# Patient Record
Sex: Male | Born: 1970 | Hispanic: Refuse to answer | Marital: Married | Smoking: Former smoker
Health system: Southern US, Community
[De-identification: ages and names within clinical notes are randomized; demographics above are authoritative.]

---

## 2016-06-16 DIAGNOSIS — E559 Vitamin D deficiency, unspecified: Secondary | ICD-10-CM | POA: Insufficient documentation

## 2016-06-16 DIAGNOSIS — N529 Male erectile dysfunction, unspecified: Secondary | ICD-10-CM | POA: Insufficient documentation

## 2016-06-16 DIAGNOSIS — E785 Hyperlipidemia, unspecified: Secondary | ICD-10-CM | POA: Insufficient documentation

## 2017-08-15 ENCOUNTER — Ambulatory Visit
Admission: RE | Admit: 2017-08-15 | Discharge: 2017-08-15 | Disposition: A | Discharge status: Home / Self Care | Payer: BLUE CROSS/BLUE SHIELD | Source: Ambulatory Visit | Attending: Medical | Admitting: Medical

## 2017-08-15 ENCOUNTER — Encounter: Payer: Self-pay | Admitting: Medical

## 2017-08-15 ENCOUNTER — Ambulatory Visit: Payer: Self-pay | Admitting: Medical

## 2017-08-15 VITALS — BP 130/82 | HR 87 | Temp 98.9°F | Ht 72.0 in | Wt 215.0 lb

## 2017-08-15 DIAGNOSIS — M25572 Pain in left ankle and joints of left foot: Secondary | ICD-10-CM | POA: Diagnosis present

## 2017-08-15 DIAGNOSIS — M19072 Primary osteoarthritis, left ankle and foot: Secondary | ICD-10-CM | POA: Diagnosis not present

## 2017-08-15 NOTE — Patient Instructions (Addendum)
Ice , elevation , anti-inflammatory like Ibuprofen  every 8 hours with food. Will contact you tomorrow for results.   Ankle Pain Many things can cause ankle pain, including an injury to the area and overuse of the ankle.The ankle joint holds your body weight and allows you to move around. Ankle pain can occur on either side or the back of one ankle or both ankles. Ankle pain may be sharp and burning or dull and aching. There may be tenderness, stiffness, redness, or warmth around the ankle. Follow these instructions at home: Activity  Rest your ankle as told by your health care provider. Avoid any activities that cause ankle pain.  Do exercises as told by your health care provider.  Ask your health care provider if you can drive. Using a brace, a bandage, or crutches  If you were given a brace: ? Wear it as told by your health care provider. ? Remove it when you take a bath or a shower. ? Try not to move your ankle very much, but wiggle your toes from time to time. This helps to prevent swelling.  If you were given an elastic bandage: ? Remove it when you take a bath or a shower. ? Try not to move your ankle very much, but wiggle your toes from time to time. This helps to prevent swelling. ? Adjust the bandage to make it more comfortable if it feels too tight. ? Loosen the bandage if you have numbness or tingling in your foot or if your foot turns cold and blue.  If you have crutches, use them as told by your health care provider. Continue to use them until you can walk without feeling pain in your ankle. Managing pain, stiffness, and swelling  Raise (elevate) your ankle above the level of your heart while you are sitting or lying down.  If directed, apply ice to the area: ? Put ice in a plastic bag. ? Place a towel between your skin and the bag. ? Leave the ice on for 20 minutes, 2-3 times per day. General instructions  Keep all follow-up visits as told by your health care  provider. This is important.  Record this information that may be helpful for you and your health care provider: ? How often you have ankle pain. ? Where the pain is located. ? What the pain feels like.  Take over-the-counter and prescription medicines only as told by your health care provider. Contact a health care provider if:  Your pain gets worse.  Your pain is not relieved with medicines.  You have a fever or chills.  You are having more trouble with walking.  You have new symptoms. Get help right away if:  Your foot, leg, toes, or ankle tingles or becomes numb.  Your foot, leg, toes, or ankle becomes swollen.  Your foot, leg, toes, or ankle turns pale or blue. This information is not intended to replace advice given to you by your health care provider. Make sure you discuss any questions you have with your health care provider. Document Released: 04/20/2010 Document Revised: 07/01/2016 Document Reviewed: 06/02/2015 Elsevier Interactive Patient Education  2017 [employer] Inc.  Elastic Bandage and RICE What does an elastic bandage do? Elastic bandages come in different shapes and sizes. They generally provide support to your injury and reduce swelling while you are healing, but they can perform different functions. Your health care provider will help you to decide what is best for your protection, recovery, or rehabilitation following  an injury. What are some general tips for using an elastic bandage?  Use the bandage as directed by the maker of the bandage that you are using.  Do not wrap the bandage too tightly. This may cut off the circulation in the arm or leg in the area below the bandage. ? If part of your body beyond the bandage becomes blue, numb, cold, swollen, or is more painful, your bandage is most likely too tight. If this occurs, remove your bandage and reapply it more loosely.  See your health care provider if the bandage seems to be making your problems  worse rather than better.  An elastic bandage should be removed and reapplied every 3-4 hours or as directed by your health care provider. What is RICE? The routine care of many injuries includes rest, ice, compression, and elevation (RICE therapy). Rest Rest is required to allow your body to heal. Generally, you can resume your routine activities when you are comfortable and have been given permission by your health care provider. Ice Icing your injury helps to keep the swelling down and it reduces pain. Do not apply ice directly to your skin.  Put ice in a plastic bag.  Place a towel between your skin and the bag.  Leave the ice on for 20 minutes, 2-3 times per day.  Do this for as long as you are directed by your health care provider. Compression Compression helps to keep swelling down, gives support, and helps with discomfort. Compression may be done with an elastic bandage. Elevation Elevation helps to reduce swelling and it decreases pain. If possible, your injured area should be placed at or above the level of your heart or the center of your chest. When should I seek medical care? You should seek medical care if:  You have persistent pain and swelling.  Your symptoms are getting worse rather than improving.  These symptoms may indicate that further evaluation or further X-rays are needed. Sometimes, X-rays may not show a small broken bone (fracture) until a number of days later. Make a follow-up appointment with your health care provider. Ask when your X-ray results will be ready. Make sure that you get your X-ray results. When should I seek immediate medical care? You should seek immediate medical care if:  You have a sudden onset of severe pain at or below the area of your injury.  You develop redness or increased swelling around your injury.  You have tingling or numbness at or below the area of your injury that does not improve after you remove the elastic  bandage.  This information is not intended to replace advice given to you by your health care provider. Make sure you discuss any questions you have with your health care provider. Document Released: 04/22/2002 Document Revised: 09/26/2016 Document Reviewed: 06/16/2014 Elsevier Interactive Patient Education  2017 [employer].  Crutch Use, Adult Crutches are used to take weight off of one of your legs or feet when you stand or walk. You may need crutches to help heal after an injury or procedure. It is important to use crutches that fit right. Your crutches fit right if:  You can fit 2 or 3 fingers between your armpit and the crutch.  You use your hands, not your armpits, to hold yourself up.  Do not put your armpits on the crutches. This can damage the nerves in your shoulders, arms, back, armpits, and hands. It is important that a doctor has seen you use crutches the  right way before you use them at home. How to use your crutches How you will use your crutches will depend on why you need them. Your doctor may tell you not to put weight on (not to support your weight with) your hurt leg (non-weight-bearing). Or, your doctor may let you put (bear) some of your weight on the hurt leg (partial weight-bearing), but not all of your weight. Follow instructions from your doctor about weight-bearing. Do not put weight on your leg in an amount that causes pain. Walking 1. Stand on your good leg and lift both crutches at the same time. 2. Place the crutches one step-length in front of you. 3. Bring the good leg forward to meet the crutches or to land a little bit ahead of them. 4. Repeat. Going up steps If there is no handrail: 1. Step up with your good leg. 2. Step up with the crutches and your hurt leg. 3. Repeat.  If there is a handrail: 1. Hold both crutches in one hand. 2. Place your other hand on the handrail. 3. Put your weight on your arms and lift your good leg up to the  step. 4. Bring the crutches and the hurt leg up to that step. 5. Repeat.  Going up steps on your butt If you do not feel steady on steps, you can go up steps on your butt. 1. Sit on the lowest step. ? Have your hurt leg out in front. ? Use your other hand to hold both crutches flat on the stairs. 2. Scoot your butt up to the next step. Use the free hand and your good leg to help you do this.  Going down steps If there is no handrail: 1. Step down with your hurt leg and crutches. 2. Step down with your good leg. 3. Repeat.  If there is a handrail: 1. Place your hand on the handrail. 2. Hold both crutches with your free hand. 3. Lower your hurt leg and crutch to the step below you. Keep the crutch tips in the center of the step. Never put the crutch tips on the edge of the step. 4. Lower your good leg to that step. 5. Repeat.  Going down steps on your butt If you do not feel steady on steps, you can go down steps on your butt. 1. Sit on the highest step. ? Have your hurt leg out in front. ? Use your other hand to hold both crutches flat on the stairs. 2. Scoot your butt down to the next step. Use the free hand and your good leg to help you do this.  Standing up 1. Hold the hurt leg forward. 2. Grab the armrest with one hand. Use the other hand to grab the top of the crutches. 3. Use the armrest and your crutches to pull yourself up to stand. Sitting down 1. Hold the hurt leg forward. 2. Grab the armrest with one hand. Use the other hand to grab the top of the crutches. 3. Slowly lower yourself to sit. Get help if:  You feel unsteady or wobbly using crutches.  You have any new pain.  You cannot feel a part of your body (numbness) or you have a tingling feeling.  Your crutches do not fit. Get help right away if:  You fall. This information is not intended to replace advice given to you by your health care provider. Make sure you discuss any questions you have with your  health care provider. Document  Released: 04/18/2008 Document Revised: 06/03/2016 Document Reviewed: 04/22/2016 Elsevier Interactive Patient Education  [employer].

## 2017-08-15 NOTE — Progress Notes (Signed)
   Subjective:    Patient ID: Philip Gibson, male    DOB: 1971/06/30, 46 y.o.   MRN: [id number]  HPI 46 yo male non acute distress with  left ankle pain starting yesterday.Denies Trauma, did stairs in parking garage , so sitting for  jury duty and pain came on , started as a dull ache. After dinner with throbbing. Took Ibuprofen 400 mg last night at  11:30pm last night and 2 tablets this morning.  Denies numbness or tingling. Pain continuing to worsen.  Review of Systems  Constitutional: Negative for chills and fever.  HENT: Positive for congestion. Negative for ear pain and sore throat.   Eyes: Negative for discharge and itching.  Respiratory: Negative for cough and shortness of breath.   Cardiovascular: Negative for chest pain.  Gastrointestinal: Negative for abdominal pain.  Endocrine: Negative for polydipsia, polyphagia and polyuria.  Genitourinary: Negative for dysuria.  Musculoskeletal: Positive for gait problem and joint swelling. Negative for back pain and neck stiffness.  Skin: Negative for rash and wound.  Allergic/Immunologic: Positive for environmental allergies. Negative for food allergies.  Neurological: Negative for dizziness, speech difficulty and light-headedness.  Hematological: Negative for adenopathy. Does not bruise/bleed easily.  Psychiatric/Behavioral: Negative for behavioral problems, sleep disturbance and suicidal ideas. The patient is not nervous/anxious.        Objective:   Physical Exam  Constitutional: He is oriented to person, place, and time. He appears well-developed and well-nourished.  HENT:  Head: Normocephalic and atraumatic.  Eyes: Pupils are equal, round, and reactive to light. Conjunctivae and EOM are normal.  Neck: Normal range of motion.  Musculoskeletal: He exhibits edema and tenderness. He exhibits no deformity.  Neurological: He is alert and oriented to person, place, and time.  Skin: Skin is warm and dry. No rash noted. No erythema.   Psychiatric: He has a normal mood and affect. His behavior is normal. Judgment and thought content normal.  Nursing note and vitals reviewed.  Left ankle with anterior and inferior swelling to the lateral malleolus and tender at the medial malleolus with mild swelling. No bruising noted.   5"11.5" for crutch size.    Assessment & Plan:  Left ankle pain. Non- weightbearing.Crutches supplied. Ace wrappedin clinic, showed patient how to do wrap ankle.  Ice, elevate and anti-inflammatory like OTC Ibruprofen 800 mg every  8 hours with food. Will call patient in the morning with results. Has appointment set for  8:30 am.  Declined Ibuprofen in the clinic. Going over now for x-ray. To [facility].  Called patient with results referred to [facility]. Xray results left ankle. There is no evidence of acute fracture, dislocation, or joint effusion. 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 Small loose body in the joint adjacent to the lateral aspect of the tibial plafond. Irregularity of the dome of the talus. Soft tissues are unremarkable.  IMPRESSION: Osteoarthritis of the ankle joint.  No acute abnormality.

## 2017-08-16 ENCOUNTER — Ambulatory Visit: Payer: Self-pay | Admitting: Medical

## 2017-08-16 VITALS — BP 138/70 | HR 71 | Temp 97.4°F | Resp 16 | Ht 71.0 in | Wt 215.0 lb

## 2017-08-16 DIAGNOSIS — M25572 Pain in left ankle and joints of left foot: Secondary | ICD-10-CM

## 2017-08-16 NOTE — Patient Instructions (Signed)
ICE and Elevate, ace wrap, OTC Ibuprofen 800 mg every 8 hours with food x 7 days.    Ankle Sprain An ankle sprain is a stretch or tear in one of the tough, fiber-like tissues (ligaments) in the ankle. The ligaments in your ankle help to hold the bones of the ankle together. What are the causes? This condition is often caused by stepping on or falling on the outer edge of the foot. What increases the risk? This condition is more likely to develop in people who play sports. What are the signs or symptoms? Symptoms of this condition include:  Pain in your ankle.  Swelling.  Bruising. Bruising may develop right after you sprain your ankle or 1-2 days later.  Trouble standing or walking, especially when you turn or change directions.  How is this diagnosed? This condition is diagnosed with a physical exam. During the exam, your health care provider will press on certain parts of your foot and ankle and try to move them in certain ways. X-rays may be taken to see how severe the sprain is and to check for broken bones. How is this treated? This condition may be treated with:  A brace. This is used to keep the ankle from moving until it heals.  An elastic bandage. This is used to support the ankle.  Crutches.  Pain medicine.  Surgery. This may be needed if the sprain is severe.  Physical therapy. This may help to improve the range of motion in the ankle.  Follow these instructions at home:  Rest your ankle.  Take over-the-counter and prescription medicines only as told by your health care provider.  For 2-3 days, keep your ankle raised (elevated) above the level of your heart as much as possible.  If directed, apply ice to the area: ? Put ice in a plastic bag. ? Place a towel between your skin and the bag. ? Leave the ice on for 20 minutes, 2-3 times a day.  If you were given a brace: ? Wear it as directed. ? Remove it to shower or bathe. ? Try not to move your ankle  much, but wiggle your toes from time to time. This helps to prevent swelling.  If you were given an elastic bandage (dressing): ? Remove it to shower or bathe. ? Try not to move your ankle much, but wiggle your toes from time to time. This helps to prevent swelling. ? Adjust the dressing to make it more comfortable if it feels too tight. ? Loosen the dressing if you have numbness or tingling in your foot, or if your foot becomes cold and blue.  If you have crutches, use them as told by your health care provider. Continue to use them until you can walk without feeling pain in your ankle. Contact a health care provider if:  You have rapidly increasing bruising or swelling.  Your pain is not relieved with medicine. Get help right away if:  Your toes or foot becomes numb or blue.  You have severe pain that gets worse. This information is not intended to replace advice given to you by your health care provider. Make sure you discuss any questions you have with your health care provider. Document Released: 10/31/2005 Document Revised: 03/09/2016 Document Reviewed: 06/02/2015 Elsevier Interactive Patient Education  2017 [employer].

## 2017-08-16 NOTE — Progress Notes (Signed)
   Subjective:    Patient ID: Philip Gibson, male    DOB: 05/03/1971, 46 y.o.   MRN: [id number]  HPI 46 yo male  Non acute distress returns today for review of x-ray results. Feelling much better not aching. Still hurts to bear weight but much better than yesterday. Took OTC Ibuprofen 400 mg, with breakfast. No numbness  Or tingling.Best number [phone] He has 4 children at home with  Twin 90 month old boys , he recalls he may have stepped on a toy or something , but does not recall specific time period. 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  Review of Systems  Constitutional: Negative for chills and fever.  HENT: Negative for ear discharge and sore throat.   Respiratory: Negative for cough and shortness of breath.   Cardiovascular: Negative for chest pain.  Gastrointestinal: Negative for abdominal pain.  Genitourinary: Negative for dysuria.  Musculoskeletal: Positive for arthralgias and joint swelling.  Skin: Negative for rash.       Objective:   Physical Exam  Constitutional: He is oriented to person, place, and time. He appears well-developed and well-nourished.  HENT:  Head: Normocephalic and atraumatic.  Right Ear: External ear normal.  Left Ear: External ear normal.  Eyes: Pupils are equal, round, and reactive to light. Conjunctivae and EOM are normal.  Musculoskeletal: He exhibits edema and tenderness.  Neurological: He is alert and oriented to person, place, and time.  Skin: Skin is warm and dry.  Psychiatric: He has a normal mood and affect. His behavior is normal. Judgment and thought content normal.  Nursing note and vitals reviewed.   Patient able to touch step with forefoot. Better compared to yesterday. Ankle still swollen the same as yesterday on medial and lateral side, anterior to lateral malleolus., 2+ PT/DP. <2CR. Rewrapped ankle for patient. He is using crutches.     Assessment & Plan:  Ankle pain.  Most likely sprained. Will call patient and decide on plan once  I have x-ray results. Rewrapped in clinic To continue OTC Ibuprofen 800 mg every 8 hours with food no more than  7 days. Work note saying he may need to go home to ice and elevate ankle. He brought Ibuprofen to work with him. And he has a couple of meetings he needs to attend today.   9:21 am called patient with xray result. There is no evidence of acute fracture, dislocation, or joint effusion. 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 Small loose body in the joint adjacent to the lateral aspect of the tibial plafond. Irregularity of the dome of the talus. Soft tissues are unremarkable. Notified CNovak RN to set up orthopedic appointment ,

## 2017-08-19 DIAGNOSIS — R7989 Other specified abnormal findings of blood chemistry: Secondary | ICD-10-CM | POA: Insufficient documentation

## 2017-11-17 ENCOUNTER — Encounter: Payer: Self-pay | Admitting: Medical

## 2017-11-17 ENCOUNTER — Ambulatory Visit: Payer: Self-pay | Admitting: Medical

## 2017-11-17 VITALS — BP 110/70 | HR 82 | Temp 97.0°F | Resp 16 | Ht 71.0 in | Wt 218.0 lb

## 2017-11-17 DIAGNOSIS — J0110 Acute frontal sinusitis, unspecified: Secondary | ICD-10-CM

## 2017-11-17 MED ORDER — AMOXICILLIN-POT CLAVULANATE 875-125 MG PO TABS: 1.0000 | ORAL_TABLET | Freq: Two times a day (BID) | ORAL | 0 refills | Status: AC

## 2017-11-17 NOTE — Patient Instructions (Signed)
S Sinusitis, Adult Sinusitis is soreness and inflammation of your sinuses. Sinuses are hollow spaces in the bones around your face. They are located:  Around your eyes.  In the middle of your forehead.  Behind your nose.  In your cheekbones.  Your sinuses and nasal passages are lined with a stringy fluid (mucus). Mucus normally drains out of your sinuses. When your nasal tissues get inflamed or swollen, the mucus can get trapped or blocked so air cannot flow through your sinuses. This lets bacteria, viruses, and funguses grow, and that leads to infection. Follow these instructions at home: Medicines  Take, use, or apply over-the-counter and prescription medicines only as told by your doctor. These may include nasal sprays.  If you were prescribed an antibiotic medicine, take it as told by your doctor. Do not stop taking the antibiotic even if you start to feel better. Hydrate and Humidify  Drink enough water to keep your pee (urine) clear or pale yellow.  Use a cool mist humidifier to keep the humidity level in your home above 50%.  Breathe in steam for 10-15 minutes, 3-4 times a day or as told by your doctor. You can do this in the bathroom while a hot shower is running.  Try not to spend time in cool or dry air. Rest  Rest as much as possible.  Sleep with your head raised (elevated).  Make sure to get enough sleep each night. General instructions  Put a warm, moist washcloth on your face 3-4 times a day or as told by your doctor. This will help with discomfort.  Wash your hands often with soap and water. If there is no soap and water, use hand sanitizer.  Do not smoke. Avoid being around people who are smoking (secondhand smoke).  Keep all follow-up visits as told by your doctor. This is important. Contact a doctor if:  You have a fever.  Your symptoms get worse.  Your symptoms do not get better within 10 days. Get help right away if:  You have a very bad  headache.  You cannot stop throwing up (vomiting).  You have pain or swelling around your face or eyes.  You have trouble seeing.  You feel confused.  Your neck is stiff.  You have trouble breathing. This information is not intended to replace advice given to you by your health care provider. Make sure you discuss any questions you have with your health care provider. Document Released: 04/18/2008 Document Revised: 06/26/2016 Document Reviewed: 08/26/2015 Elsevier Interactive Patient Education  [employer]2018 [employer] Inc[employer]

## 2017-11-17 NOTE — Progress Notes (Signed)
   Subjective:    Patient ID: Philip Gibson, male    DOB: 07/03/1971, 47 y.o.   MRN: [id number]  HPI  47 yo male in non acute distress comes in with complaints of starting  5-7 days ago , with pressure and thick yellow mucus, taking OTC Mucinex and Advil. Denies Fever or chills. Pressure on the left more than the right behind eyes and on forehead..  Review of Systems  Constitutional: Negative for chills and fever.  HENT: Positive for postnasal drip, sinus pressure and sinus pain. Negative for ear pain and sore throat (irritated from post nasal drip.).   Eyes: Negative for discharge and itching.  Respiratory: Positive for cough (due to phelgm in throat). Negative for shortness of breath.   Cardiovascular: Negative for chest pain.  Gastrointestinal: Negative for abdominal pain.  Endocrine: Negative for polydipsia, polyphagia and polyuria.  Genitourinary: Negative for dysuria.  Musculoskeletal: Negative for myalgias.  Skin: Negative for rash.  Allergic/Immunologic: Negative for environmental allergies and food allergies.  Neurological: Negative for dizziness, syncope and light-headedness.  Hematological: Negative for adenopathy.  Psychiatric/Behavioral: Negative for behavioral problems, self-injury and suicidal ideas. The patient is not nervous/anxious.        Objective:   Physical Exam  Constitutional: He is oriented to person, place, and time. He appears well-developed and well-nourished.  HENT:  Head: Normocephalic and atraumatic.  Right Ear: Hearing, external ear and ear canal normal. A middle ear effusion is present.  Left Ear: Hearing, external ear and ear canal normal. A middle ear effusion is present.  Nose: Mucosal edema and rhinorrhea present. Left sinus exhibits frontal sinus tenderness.  Mouth/Throat: Uvula is midline. Posterior oropharyngeal erythema present. No posterior oropharyngeal edema.  Eyes: Conjunctivae, EOM and lids are normal. Pupils are equal, round, and  reactive to light.  Neck: Normal range of motion. Neck supple.  Cardiovascular: Normal rate, regular rhythm and normal heart sounds.  Pulmonary/Chest: Effort normal and breath sounds normal.  Musculoskeletal: Normal range of motion.  Lymphadenopathy:    He has no cervical adenopathy.  Neurological: He is alert and oriented to person, place, and time.  Skin: Skin is warm and dry.  Psychiatric: He has a normal mood and affect. His behavior is normal. Judgment and thought content normal.  Nursing note and vitals reviewed.         Assessment & Plan:   Sinusitis Frontal OTC Zyretec., Mucinex and Flonase and Advil take as directed. Meds ordered this encounter  Medications  . amoxicillin-clavulanate (AUGMENTIN) 875-125 MG tablet    Sig: Take 1 tablet by mouth 2 (two) times daily.    Dispense:  20 tablet    Refill:  0  Return in  3-5 days if not improving.  Patient verbalizes understanding and has no questions at discharge.

## 2019-02-07 IMAGING — CR DG ANKLE COMPLETE 3+V*L*
1 series · 3 of 3 positions shown · non-contrast
Comparison: None.

CLINICAL DATA: Acute left ankle pain.  No known injury.

EXAM:
LEFT ANKLE COMPLETE - 3+ VIEW

[Series 1: dg ankle complete left · 0.14mm/px · 3 of 3 slices shown]
[im 1/3]
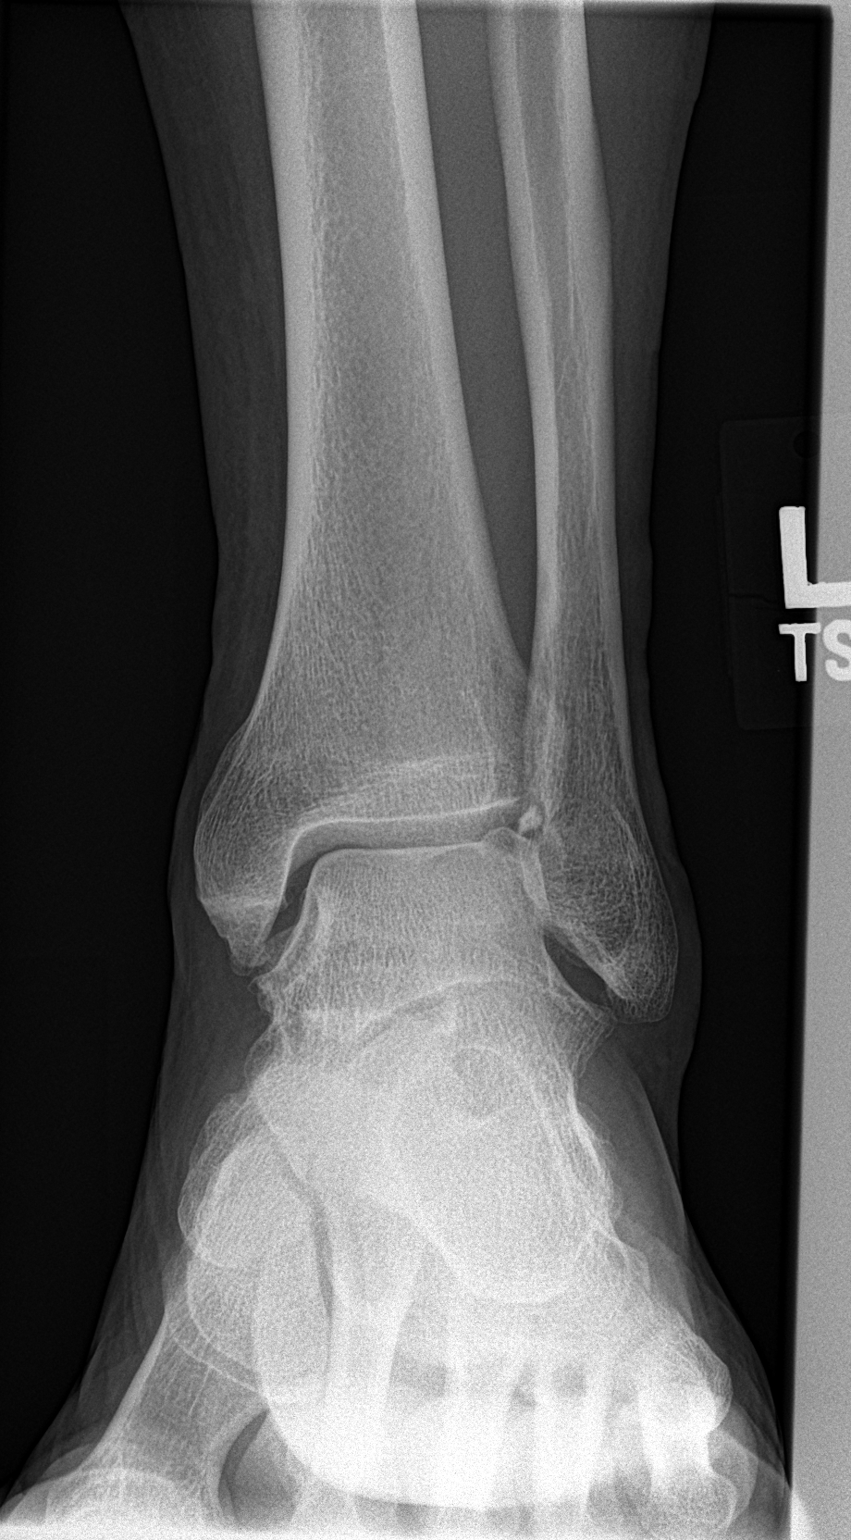
[im 2/3]
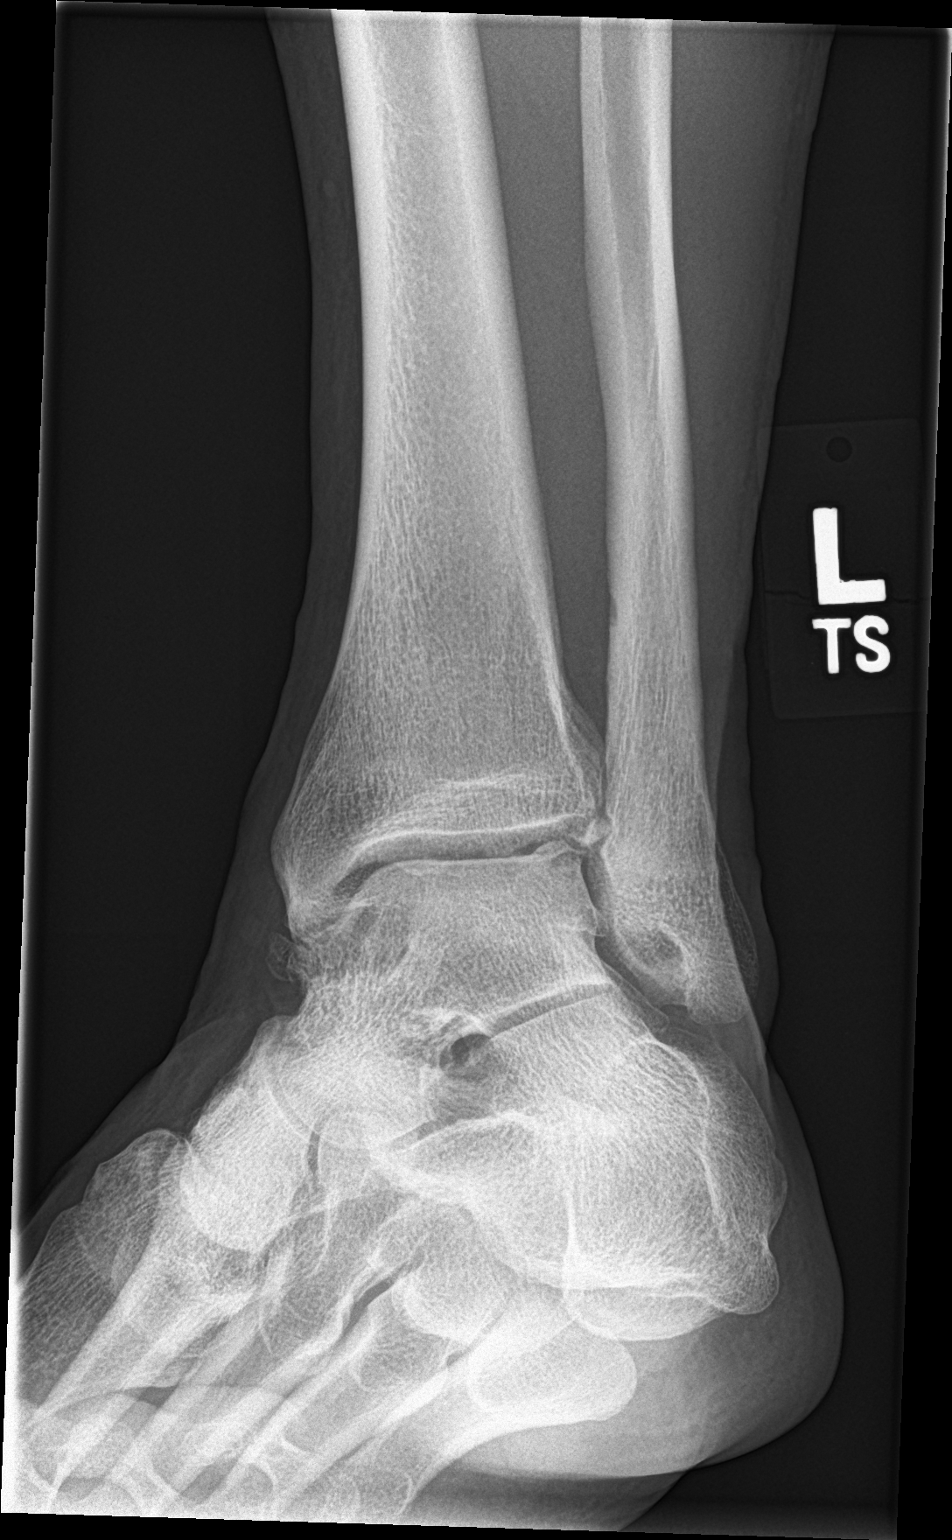
[im 3/3]
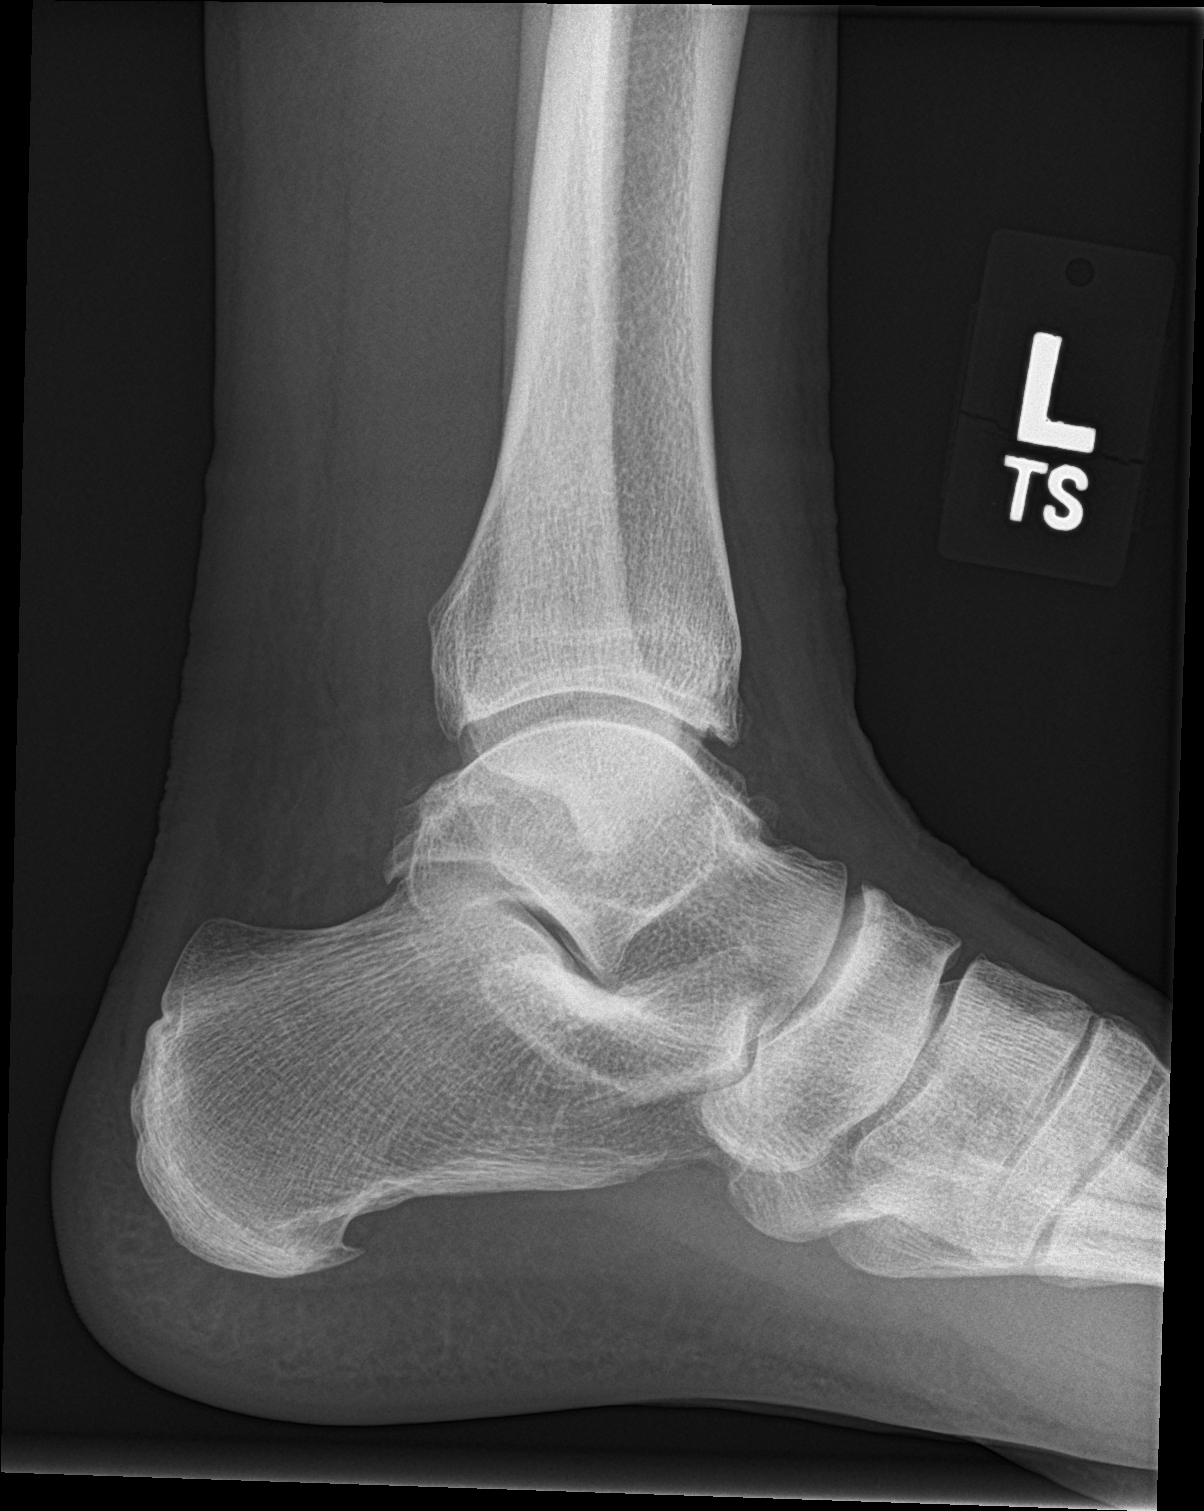

[3 of 3 positions shown; findings below may reference images not displayed]

FINDINGS: There is no evidence of acute fracture, dislocation, or joint
effusion. The patient has moderate osteoarthritis of the ankle joint
with marginal spur formation. Small loose body in the joint adjacent
to the lateral aspect of the tibial plafond. Irregularity of the
dome of the talus. Soft tissues are unremarkable.
IMPRESSION: Osteoarthritis of the ankle joint.  No acute abnormality.
